# Patient Record
Sex: Female | Born: 1975 | Race: White | Hispanic: No | Smoking: Never smoker
Health system: Southern US, Community
[De-identification: ages and names within clinical notes are randomized; demographics above are authoritative.]

## PROBLEM LIST (undated history)

## (undated) HISTORY — PX: SHOULDER SURGERY: SHX246

## (undated) HISTORY — PX: ABDOMINAL HYSTERECTOMY: SHX81

---

## 1998-04-11 ENCOUNTER — Other Ambulatory Visit: Admission: RE | Admit: 1998-04-11 | Discharge: 1998-04-11 | Payer: Self-pay | Admitting: Obstetrics & Gynecology

## 1999-06-20 ENCOUNTER — Encounter: Admission: RE | Admit: 1999-06-20 | Discharge: 1999-06-20 | Payer: Self-pay | Admitting: Family Medicine

## 1999-06-20 ENCOUNTER — Encounter: Payer: Self-pay | Admitting: Family Medicine

## 1999-11-21 ENCOUNTER — Encounter: Payer: Self-pay | Admitting: Obstetrics and Gynecology

## 1999-11-21 ENCOUNTER — Ambulatory Visit (HOSPITAL_COMMUNITY): Admission: RE | Admit: 1999-11-21 | Discharge: 1999-11-21 | Payer: Self-pay | Admitting: Obstetrics and Gynecology

## 2000-04-16 ENCOUNTER — Inpatient Hospital Stay (HOSPITAL_COMMUNITY): Admission: AD | Admit: 2000-04-16 | Discharge: 2000-04-19 | Payer: Self-pay | Admitting: Obstetrics and Gynecology

## 2000-04-16 ENCOUNTER — Encounter (INDEPENDENT_AMBULATORY_CARE_PROVIDER_SITE_OTHER): Payer: Self-pay

## 2001-02-18 ENCOUNTER — Other Ambulatory Visit: Admission: RE | Admit: 2001-02-18 | Discharge: 2001-02-18 | Payer: Self-pay | Admitting: Obstetrics and Gynecology

## 2005-03-03 ENCOUNTER — Emergency Department (HOSPITAL_COMMUNITY): Admission: EM | Admit: 2005-03-03 | Discharge: 2005-03-03 | Payer: Self-pay | Admitting: Emergency Medicine

## 2005-03-17 ENCOUNTER — Ambulatory Visit (HOSPITAL_COMMUNITY): Admission: RE | Admit: 2005-03-17 | Discharge: 2005-03-17 | Payer: Self-pay | Admitting: Urology

## 2007-05-05 IMAGING — CT CT ABDOMEN W/O CM
1 series · 15 of 32 positions shown, 19 images · IV contrast (agent unspecified)
Comparison: None.

CLINICAL DATA: 29-year-old, with abdominal pain. 
 ABDOMEN CT WITHOUT CONTRAST:
TECHNIQUE: Multidetector CT imaging of the abdomen was performed following the standard protocol without IV contrast.
TECHNIQUE: Multidetector CT imaging of the pelvis was performed following the standard protocol without IV contrast.

[Series 2: renal stone · axial · 0.70mm/px · z∈[-439,-79]mm · 15 of 80 slices shown, 19 images]
[im 6/80  soft-tissue]
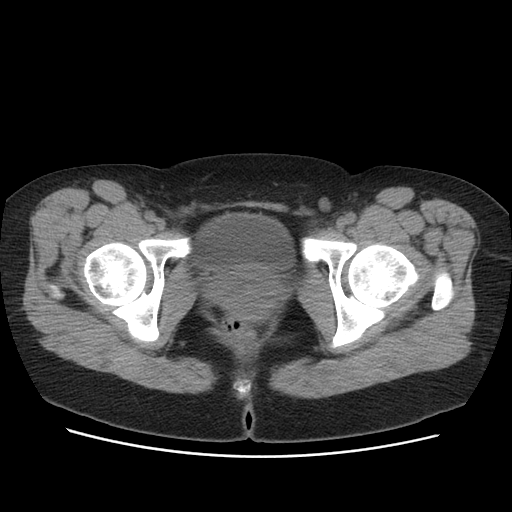
[im 6/80  bone]
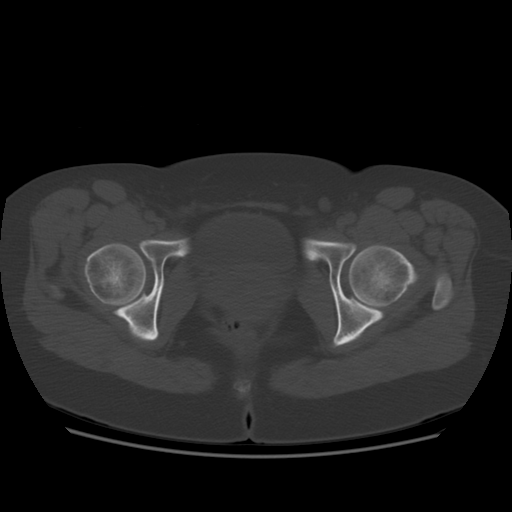
[im 11/80  soft-tissue]
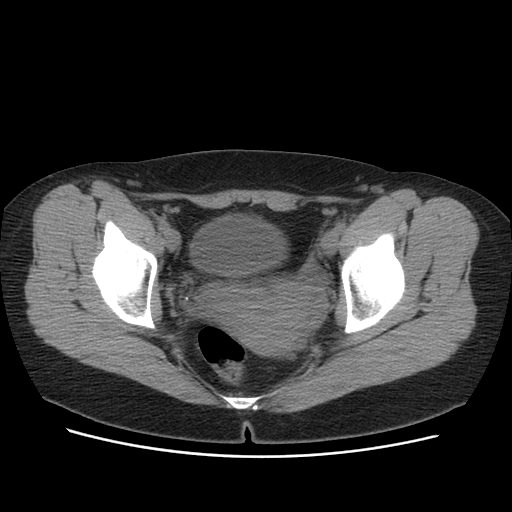
[im 16/80  soft-tissue]
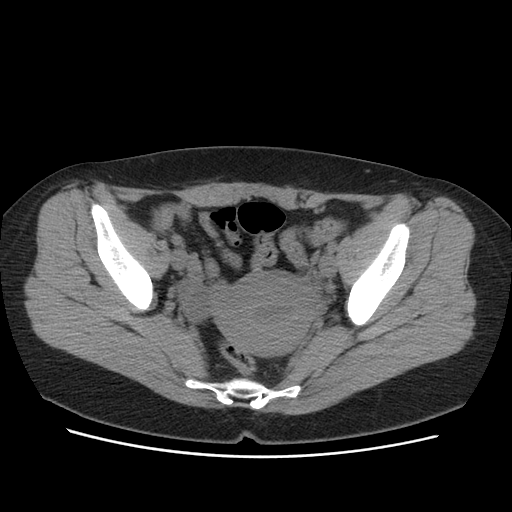
[im 23/80  soft-tissue]
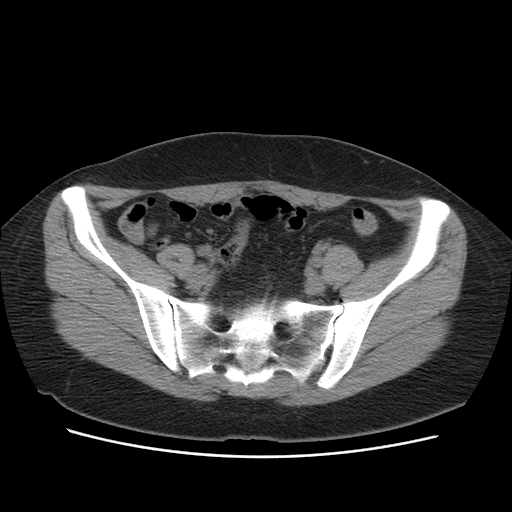
[im 29/80  soft-tissue]
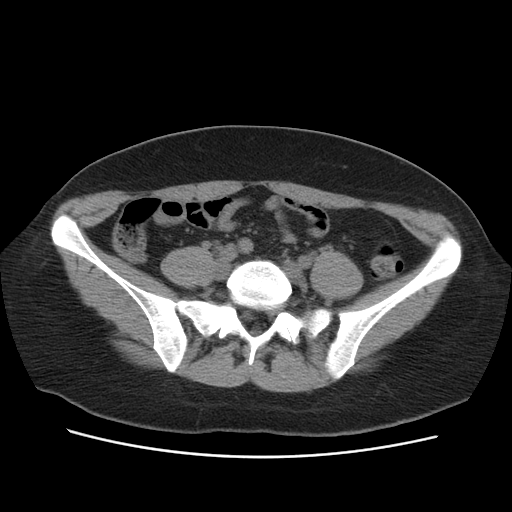
[im 34/80  soft-tissue]
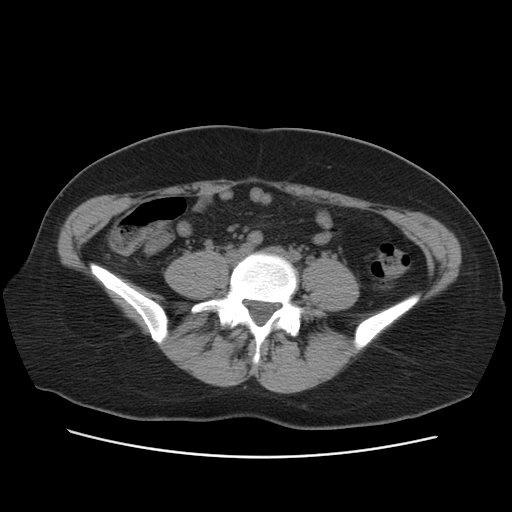
[im 41/80  soft-tissue]
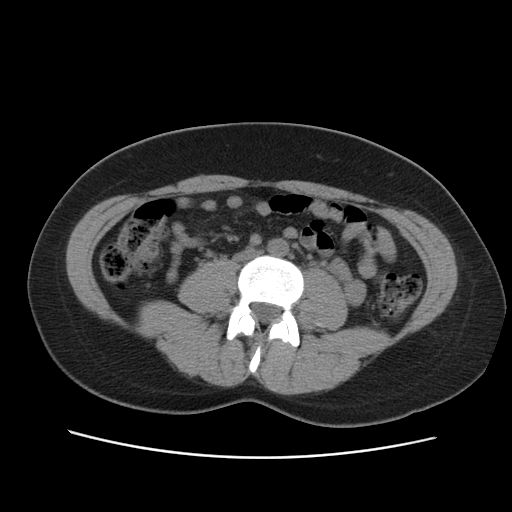
[im 46/80  soft-tissue]
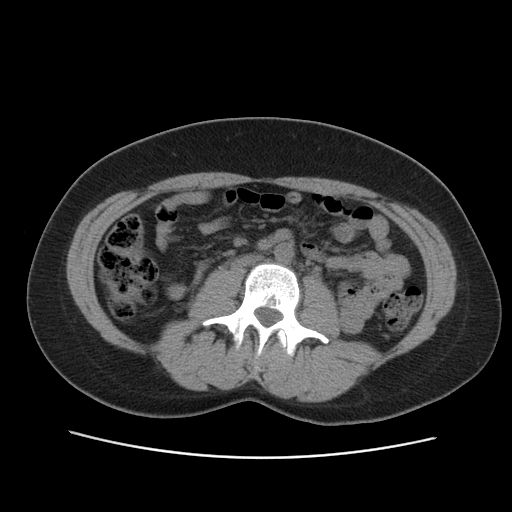
[im 51/80  soft-tissue]
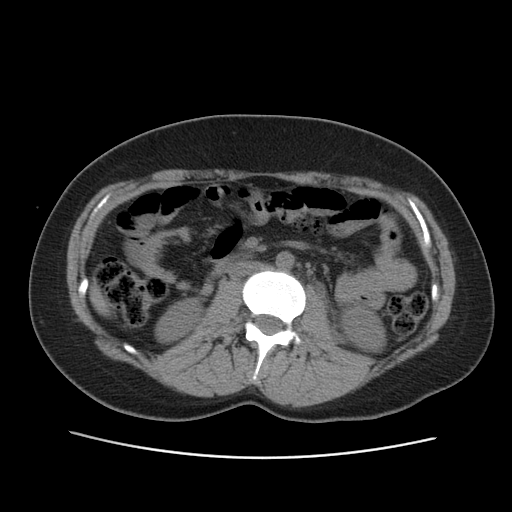
[im 51/80  bone]
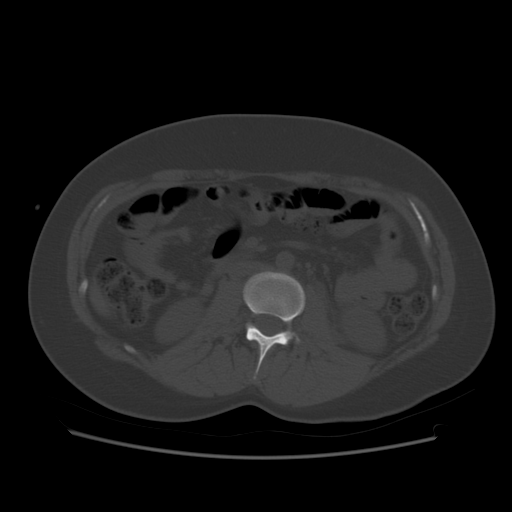
[im 57/80  soft-tissue]
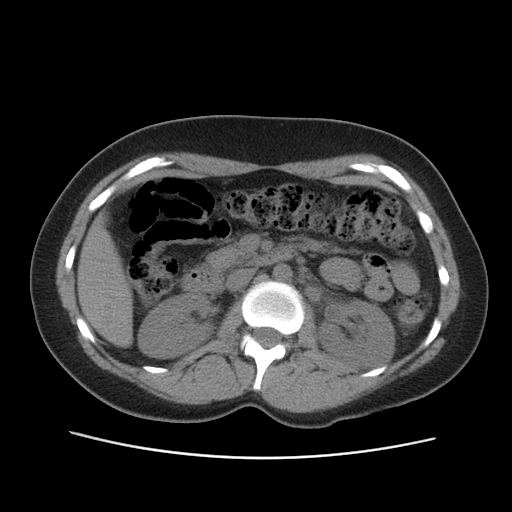
[im 64/80  soft-tissue]
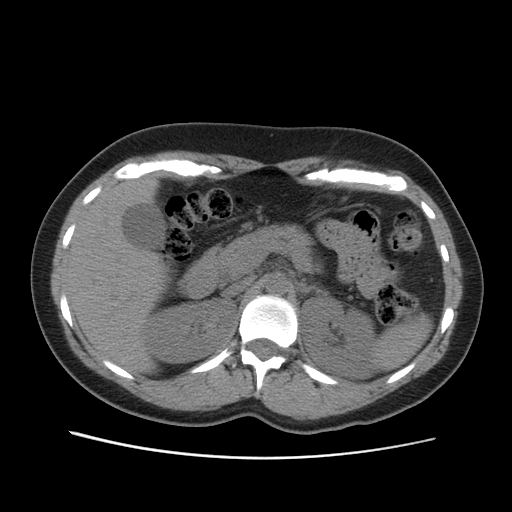
[im 69/80  soft-tissue]
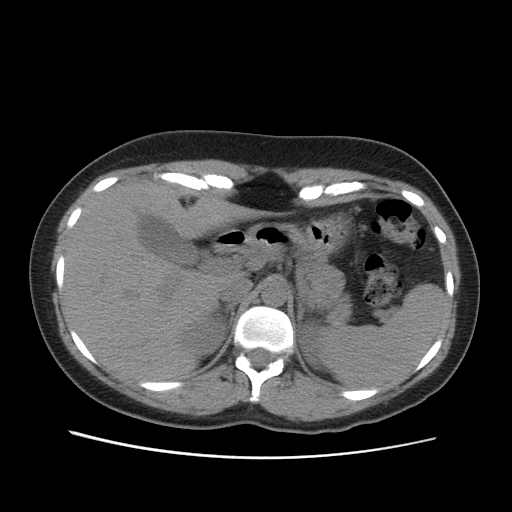
[im 69/80  lung]
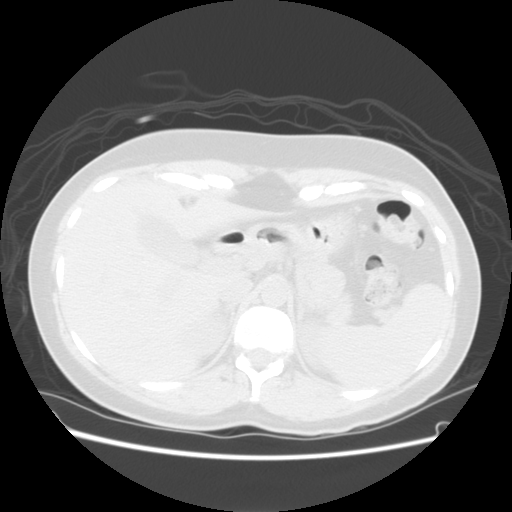
[im 72/80  lung]
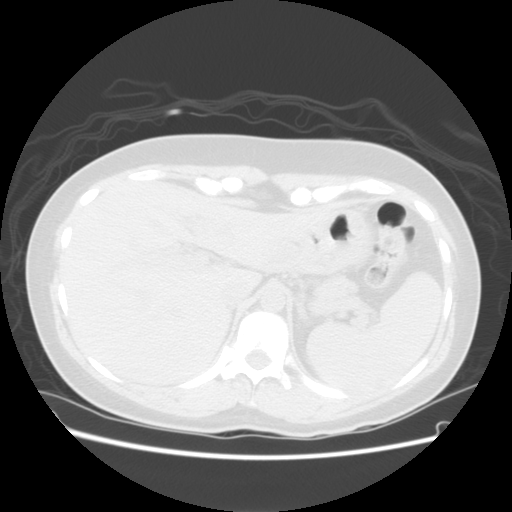
[im 74/80  soft-tissue]
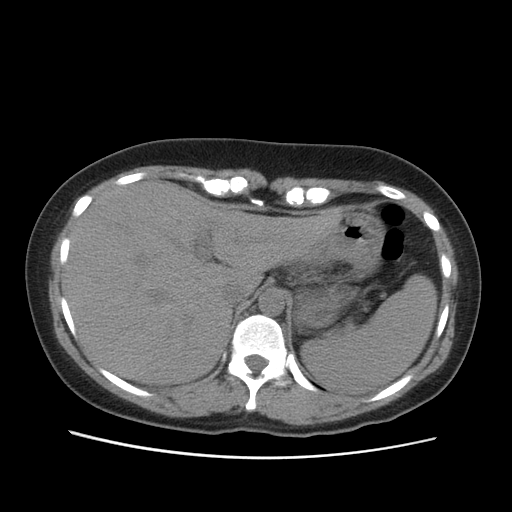
[im 74/80  lung]
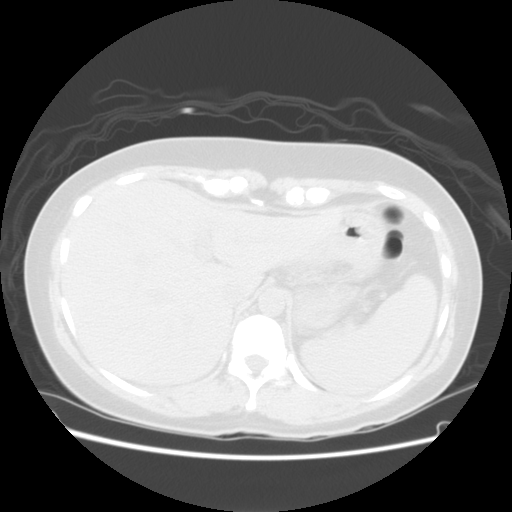
[im 77/80  lung]
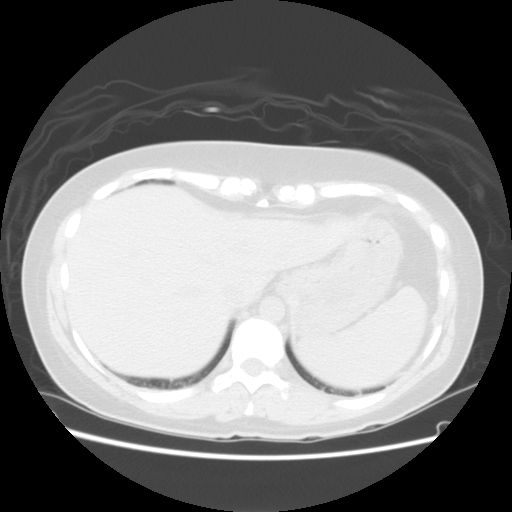

[15 of 32 positions shown; findings below may reference images not displayed]

FINDINGS: The lung bases are clear except for minimal dependent atelectasis. 
 The visualized unenhanced appearance of the liver and spleen are unremarkable.  The gallbladder appears normal.  Pancreas is grossly normal without contrast.   There are obstructive findings involving the left kidney.  There is mild hydronephrosis and there are perinephric interstitial changes and a probable small amount of perinephric fluid.  There is also inflammation around the left upper ureter which is dilated.  I do not however see any definite renal or ureteral calculi.  Findings are probably due to a recently passed stone.  Adrenal glands are unremarkable.  The right kidney is normal.  The stomach, duodenum, small bowel and colon are grossly normal but the study is limited without oral contrast.  No mesenteric or retroperitoneal masses or adenopathy.  
 The aorta is normal in caliber.
IMPRESSION: 1.  Obstructive findings involving the left kidney but no renal or ureteral calculi are seen.  Patient likely recently passed a calculus.
 2.  Remainder of the abdomen is unremarkable without contrast.  
 PELVIS CT WITHOUT CONTRAST:
FINDINGS: Mild left ureteral dilatation but no left ureteral calculus is seen.  No bladder calculi.  The uterus and ovaries are unremarkable.  The appendix is visualized and is normal.  The rectum, sigmoid colon, and visualized small bowel loops are grossly normal.
IMPRESSION: 1.  No ureteral or bladder calculi are seen.  Probable recently passed stone.
 2.  Normal uterus and ovaries.  Small amount of free pelvic fluid.

## 2009-06-07 ENCOUNTER — Encounter (INDEPENDENT_AMBULATORY_CARE_PROVIDER_SITE_OTHER): Payer: Self-pay | Admitting: Obstetrics and Gynecology

## 2009-06-07 ENCOUNTER — Ambulatory Visit (HOSPITAL_COMMUNITY): Admission: RE | Admit: 2009-06-07 | Discharge: 2009-06-08 | Payer: Self-pay | Admitting: Obstetrics and Gynecology

## 2010-05-22 LAB — COMPREHENSIVE METABOLIC PANEL
AST: 21 U/L (ref 0–37)
Albumin: 4.2 g/dL (ref 3.5–5.2)
Calcium: 9.7 mg/dL (ref 8.4–10.5)
Creatinine, Ser: 0.84 mg/dL (ref 0.4–1.2)
GFR calc Af Amer: >60 mL/min (ref >60)
GFR calc non Af Amer: >60 mL/min (ref >60)
Sodium: 136 mEq/L (ref 135–145)
Total Protein: 7.7 g/dL (ref 6.0–8.3)

## 2010-05-22 LAB — CBC
HCT: 34.9 % — ABNORMAL LOW (ref 36.0–46.0)
HCT: 39.3 % (ref 36.0–46.0)
Hemoglobin: 11.6 g/dL — ABNORMAL LOW (ref 12.0–15.0)
Hemoglobin: 13 g/dL (ref 12.0–15.0)
MCHC: 33.2 g/dL (ref 30.0–36.0)
MCHC: 33.2 g/dL (ref 30.0–36.0)
MCV: 86.8 fL (ref 78.0–100.0)
MCV: 87.7 fL (ref 78.0–100.0)
Platelets: 224 10*3/uL (ref 150–400)
RBC: 3.98 MIL/uL (ref 3.87–5.11)
RBC: 4.53 MIL/uL (ref 3.87–5.11)
RDW: 15 % (ref 11.5–15.5)
WBC: 7 10*3/uL (ref 4.0–10.5)

## 2010-06-10 ENCOUNTER — Other Ambulatory Visit: Payer: Self-pay | Admitting: Family Medicine

## 2010-06-10 DIAGNOSIS — R109 Unspecified abdominal pain: Secondary | ICD-10-CM

## 2010-06-11 ENCOUNTER — Ambulatory Visit
Admission: RE | Admit: 2010-06-11 | Discharge: 2010-06-11 | Disposition: A | Discharge status: Home / Self Care | Payer: BC Managed Care – PPO | Source: Ambulatory Visit | Attending: Family Medicine | Admitting: Family Medicine

## 2010-06-11 DIAGNOSIS — R109 Unspecified abdominal pain: Secondary | ICD-10-CM

## 2010-07-19 NOTE — Discharge Summary (Signed)
[facility]  Patient:    Pamela Harding, Pamela Harding                        MRN: [id number] Adm. Date:  [id number] Disc. Date: [id number] Attending:  Malon Kindle                           Discharge Summary  ADMISSION HISTORY:            A 35 year old white married female, para 1-0-1-1, gravida 3, at [redacted] weeks gestation admitted for C-section and tubal sterilization.  The patient declined VBAC.  She was adamant about wanting to proceed with tubal sterilization.  I pointed out extensively the disadvantages of tubal sterilization just after delivery of the second child.  She understood and wanted to proceed.  PRENATAL LABORATORY DATA:     Blood group and type B positive with a negative antibody, nonreactive serology, rubella immune, hepatitis B surface antigen negative, HIV negative, GC and Chlamydia negative, triple screen normal, one-hour Glucola 84.  HOSPITAL COURSE:              The patient underwent a low transverse cervical cesarean section and tubal sterilization by Dr. Ambrose Mantle with Dr. Jackelyn Knife assisting under spinal anesthesia with delivery of a 6-pound 12-ounce female infant with Apgars of 9 at one and 9 at five minutes.  Postpartum, the patient did well.  She had no complications in her postop course, and on the third postop day her staples were removed, strips were applied, and she was ready for discharge.  LABORATORY DATA:              Hemoglobin on admission 12.7, hematocrit 36.6, white count 9100, platelet count 178,000.  Follow-up hemoglobin 10.9, hematocrit 31.3.  Comprehensive metabolic profile was normal except for abnormalities associated with pregnancy.  Urinalysis was negative.  Blood group and type B positive with a negative antibody.  RPR was nonreactive.  FINAL DIAGNOSES:              1. Intrauterine pregnancy at 39 weeks, delivered                                  vertex by Cesarean section, declined vaginal           birth at the Cesarean.                               2. Voluntary sterilization.  OPERATIONS PERFORMED:         1. Low transverse cervical cesarean section.                               2. Bilateral tubal ligation.  DISCHARGE CONDITION:          Improved.  DISCHARGE INSTRUCTIONS:       Instructions include our regular discharge instruction booklet.  DISCHARGE MEDICATION:         Percocet 5/325, #16 tablets, one q.4-6h. p.r.n. pain.  DISCHARGE FOLLOWUP:           The patient is advised to return in two weeks for follow-up examination. DD:  04/19/00 TD:  04/19/00 Job: [id number]

## 2010-07-19 NOTE — Op Note (Signed)
[facility]  Patient:    Pamela Harding, Pamela Harding                        MRN: [id number] Proc. Date: 04/16/00 Adm. Date:  [id number] Attending:  Malon Kindle                           Operative Report  PREOPERATIVE DIAGNOSES:       1. Intrauterine pregnancy at 39 weeks.                               2. Previous C-section.                               3. Declined vaginal birth after cesarean                                  section.                               4. Voluntary sterilization.  POSTOPERATIVE DIAGNOSES:      1. Intrauterine pregnancy at 39 weeks.                               2. Previous C-section.                               3. Declined vaginal birth after cesarean                                  section.                               4. Voluntary sterilization.  OPERATION:                    Low transverse cesarean section and bilateral tubal ligation.  OPERATOR:                     Malachi Pro. Ambrose Mantle, M.D.  ASSISTANT:                    Zenaida Niece, M.D.  ANESTHESIA:                   Spinal.  DESCRIPTION OF PROCEDURE:     The patient was brought to the operating room and spinal anesthetic was given by Dr. Pamalee Leyden.  She was then placed in the lateral tilt position.  The abdomen, vulva, vagina and urethra were prepped with Betadine solution and a Foley catheter was inserted to straight drain. The patient was placed supine. The abdomen was draped as a sterile field.  The old incision was utilized and carried in layers through the skin, subcutaneous tissue and fascia.  The fascia was then separated from the rectus muscle superiorly and inferiorly.  The rectus muscle was then cut in the midline. The peritoneum was opened with the muscle.  The bladder was retracted away.  An incision was made in the lower uterine segment peritoneum.  The bladder was pushed inferiorly.  An incision was then made into the lower uterine  segment. Clear fluid was obtained.  The incision was extended laterally with bandage scissors.  A female infant was delivered from the vertex position without difficulty.  The cord was clamped after the nose and pharynx were suctioned with the bulb.  The infant was given to the neonatologist who was in attendance.  A segment of cord was preserved in case a pH was necessary. routine cord blood studies were obtained.  The placenta was removed.  The inside of the uterus was inspected and found to be free of debris.  The cervix was dilated with ring forceps.  The uterine incision was then closed in one layer using a running suture of 0 Vicryl, locking the suture as I went. After I completed this layer, there were several bleeding points that had to be sutured with 0 and 3-0 Vicryl.  Both ovaries appeared normal.  Both tubes were normal to the fimbriated ends.  I checked with the patient a final time to make sure she wanted to proceed with tubal ligation.  She did.  I created a window in the mesosalpinx bilaterally, placed two ties of 0 plain catgut proximally and distally on each tube and cut out the intervening segment of tube after I confirmed that there was a lumen present.  I checked for any bleeding. There was no bleeding.  Reinspection of the uterine incision site showed good hemostasis.  Liberal irrigation confirmed hemostasis.  No reperitonealization was done of the pelvis or the abdominal wall.  The rectus muscle was closed with interrupted sutures of 0 Vicryl.  The fascia was closed with two running sutures of 0 Vicryl, the subcu with running 3-0 Vicryl and the skin was close with automatic staples.  The patient tolerated the procedure well.  Blood loss was about 800 cc.  Sponge and needle counts were correct.  The infant had Apgars of 9 and 9 at one and five minutes. DD:  04/16/00 TD:  04/16/00 Job: [id number]

## 2010-07-19 NOTE — H&P (Signed)
[facility]  Patient:    Pamela Harding, Pamela Harding                          MRN: [id number] Adm. Date:  04/16/00 Attending:  Malachi Pro. Ambrose Mantle, M.D.                         History and Physical  HISTORY OF PRESENT ILLNESS:   This is a 35 year old white married female, para 1-0-1-1, gravida 3, with last menstrual period Jul 18, 1999 and [facility] April 26, 2000 by dates and April 22, 2000 by ultrasound, admitted for repeat C-section after declining vaginal birth after cesarean section and possible bilateral tubal ligation.  PRENATAL LABORATORY DATA:     Blood group and type: B positive with a negative antibody. Nonreactive serology. Rubella immune. Hepatitis B surface antigen negative. HIV negative. GC and Chlamydia negative. Triple screen normal. One-hour glucola 84.  This patient had a vaginal ultrasound September 19, 1999. Crown-rump length 2.3 cm and 9 weeks one day, [facility] April 22, 2000. Ultrasound on November 21, 1999 showed an average age gestational age of [redacted] weeks one day with an [facility] of April 22, 2000.  The patient had an uncomplicated prenatal course. She  declined vaginal birth after cesarean section. She requested a C-section even if she entered labor. She is admitted now for the repeat C-section and possible bilateral tubal ligation.  PAST MEDICAL HISTORY:         Pregnancy induced high blood pressure during her first pregnancy.  ALLERGIES:                    PENICILLIN.  OPERATIONS:                   Right elbow in 1984. C-section in 1994.  FAMILY HISTORY:               Father with high blood pressure and diabetes.  REVIEW OF SYSTEMS:            Noncontributory.  OBSTETRIC HISTORY:            August 1994 C-section of a 7-pound 8-ounce female infant. The patient had high blood pressure. In 1998 she had a spontaneous abortion.  PHYSICAL EXAMINATION:  GENERAL:                      Well-developed, well-nourished white female in no acute  distress.  VITAL SIGNS:                  Blood pressure 110/70. Pulse is 80.  HEENT:                        No cranial abnormalities. Extraocular movements are intact. Nose and pharynx clear.  NECK:                         Supple without thyromegaly.  HEART:                        Normal size and sounds. No murmurs.  LUNGS:                        Clear to P&A.  BREASTS:  Soft without masses.  ABDOMEN:                      Soft. Fundal height 37 cm. Fetal heart tones normal.  PELVIC:                       Cervix long and closed with presenting part at a -3.  ADMITTING IMPRESSION:         1. Intrauterine pregnancy at 39 weeks for repeat                                  cesarean section, declined vaginal birth                                  after cesarean section.                               2. Voluntary sterilization.  The patient was counseled at great length regarding tubal ligation, and was advised that I do not consider it to be in her best interest to proceed with tubal ligation at this time. She and her husband were both counseled. She understands that I will proceed with the procedure if she requests that I do it since it is not illegal, it is just unwise in my opinion. She has been counseled and she understands and I will go by her decision at the time of the surgery. DD:  04/15/00 TD:  04/15/00 Job: [id number]

## 2011-12-22 ENCOUNTER — Other Ambulatory Visit: Payer: Self-pay | Admitting: Obstetrics and Gynecology

## 2011-12-22 DIAGNOSIS — N644 Mastodynia: Secondary | ICD-10-CM

## 2011-12-29 ENCOUNTER — Ambulatory Visit
Admission: RE | Admit: 2011-12-29 | Discharge: 2011-12-29 | Disposition: A | Discharge status: Home / Self Care | Payer: BC Managed Care – PPO | Source: Ambulatory Visit | Attending: Obstetrics and Gynecology | Admitting: Obstetrics and Gynecology

## 2011-12-29 DIAGNOSIS — N644 Mastodynia: Secondary | ICD-10-CM

## 2012-06-02 ENCOUNTER — Telehealth: Payer: Self-pay | Admitting: Nurse Practitioner

## 2012-06-02 NOTE — Telephone Encounter (Signed)
Pt requesting refill on valtrex, last seen by [facility] 11/13, i dont know directions. Sending chart to you

## 2012-06-03 ENCOUNTER — Other Ambulatory Visit: Payer: Self-pay | Admitting: Physician Assistant

## 2012-06-03 DIAGNOSIS — B001 Herpesviral vesicular dermatitis: Secondary | ICD-10-CM

## 2012-06-03 MED ORDER — VALACYCLOVIR HCL 1 G PO TABS: 1000.0000 mg | ORAL_TABLET | Freq: Two times a day (BID) | ORAL | Status: DC

## 2012-06-04 ENCOUNTER — Other Ambulatory Visit: Payer: Self-pay | Admitting: Physician Assistant

## 2012-06-04 DIAGNOSIS — B001 Herpesviral vesicular dermatitis: Secondary | ICD-10-CM

## 2012-06-04 MED ORDER — VALACYCLOVIR HCL 1 G PO TABS: 1000.0000 mg | ORAL_TABLET | Freq: Two times a day (BID) | ORAL | Status: DC

## 2012-06-04 NOTE — Telephone Encounter (Signed)
Order authorized and sent to pharmacy

## 2012-06-07 ENCOUNTER — Other Ambulatory Visit: Payer: Self-pay | Admitting: *Deleted

## 2012-06-07 NOTE — Telephone Encounter (Signed)
Pt aware RX was done.

## 2012-08-30 ENCOUNTER — Ambulatory Visit (INDEPENDENT_AMBULATORY_CARE_PROVIDER_SITE_OTHER): Payer: BC Managed Care – PPO | Admitting: Physician Assistant

## 2012-08-30 ENCOUNTER — Telehealth: Payer: Self-pay | Admitting: Nurse Practitioner

## 2012-08-30 ENCOUNTER — Encounter: Payer: Self-pay | Admitting: Physician Assistant

## 2012-08-30 VITALS — BP 118/79 | HR 59 | Temp 99.7°F | Wt 155.0 lb

## 2012-08-30 DIAGNOSIS — M25569 Pain in unspecified knee: Secondary | ICD-10-CM

## 2012-08-30 DIAGNOSIS — M25562 Pain in left knee: Secondary | ICD-10-CM

## 2012-08-30 MED ORDER — MELOXICAM 15 MG PO TABS: 15.0000 mg | ORAL_TABLET | Freq: Every day | ORAL | Status: DC

## 2012-08-30 NOTE — Patient Instructions (Signed)
Knee Pain  The knee is the complex joint between your thigh and your lower leg. It is made up of bones, tendons, ligaments, and cartilage. The bones that make up the knee are:   The femur in the thigh.   The tibia and fibula in the lower leg.   The patella or kneecap riding in the groove on the lower femur.  CAUSES   Knee pain is a common complaint with many causes. A few of these causes are:   Injury, such as:   A ruptured ligament or tendon injury.   Torn cartilage.   Medical conditions, such as:   Gout   Arthritis   Infections   Overuse, over training or overdoing a physical activity.  Knee pain can be minor or severe. Knee pain can accompany debilitating injury. Minor knee problems often respond well to self-care measures or get well on their own. More serious injuries may need medical intervention or even surgery.  SYMPTOMS  The knee is complex. Symptoms of knee problems can vary widely. Some of the problems are:   Pain with movement and weight bearing.   Swelling and tenderness.   Buckling of the knee.   Inability to straighten or extend your knee.   Your knee locks and you cannot straighten it.   Warmth and redness with pain and fever.   Deformity or dislocation of the kneecap.  DIAGNOSIS   Determining what is wrong may be very straight forward such as when there is an injury. It can also be challenging because of the complexity of the knee. Tests to make a diagnosis may include:   Your caregiver taking a history and doing a physical exam.   Routine X-rays can be used to rule out other problems. X-rays will not reveal a cartilage tear. Some injuries of the knee can be diagnosed by:   Arthroscopy a surgical technique by which a small video camera is inserted through tiny incisions on the sides of the knee. This procedure is used to examine and repair internal knee joint problems. Tiny instruments can be used during arthroscopy to repair the torn knee cartilage (meniscus).   Arthrography  is a radiology technique. A contrast liquid is directly injected into the knee joint. Internal structures of the knee joint then become visible on X-ray film.   An MRI scan is a non x-ray radiology procedure in which magnetic fields and a computer produce two- or three-dimensional images of the inside of the knee. Cartilage tears are often visible using an MRI scanner. MRI scans have largely replaced arthrography in diagnosing cartilage tears of the knee.   Blood work.   Examination of the fluid that helps to lubricate the knee joint (synovial fluid). This is done by taking a sample out using a needle and a syringe.  TREATMENT  The treatment of knee problems depends on the cause. Some of these treatments are:   Depending on the injury, proper casting, splinting, surgery or physical therapy care will be needed.   Give yourself adequate recovery time. Do not overuse your joints. If you begin to get sore during workout routines, back off. Slow down or do fewer repetitions.   For repetitive activities such as cycling or running, maintain your strength and nutrition.   Alternate muscle groups. For example if you are a weight lifter, work the upper body on one day and the lower body the next.   Either tight or weak muscles do not give the proper support for your   knee. Tight or weak muscles do not absorb the stress placed on the knee joint. Keep the muscles surrounding the knee strong.   Take care of mechanical problems.   If you have flat feet, orthotics or special shoes may help. See your caregiver if you need help.   Arch supports, sometimes with wedges on the inner or outer aspect of the heel, can help. These can shift pressure away from the side of the knee most bothered by osteoarthritis.   A brace called an "unloader" brace also may be used to help ease the pressure on the most arthritic side of the knee.   If your caregiver has prescribed crutches, braces, wraps or ice, use as directed. The acronym for  this is PRICE. This means protection, rest, ice, compression and elevation.   Nonsteroidal anti-inflammatory drugs (NSAID's), can help relieve pain. But if taken immediately after an injury, they may actually increase swelling. Take NSAID's with food in your stomach. Stop them if you develop stomach problems. Do not take these if you have a history of ulcers, stomach pain or bleeding from the bowel. Do not take without your caregiver's approval if you have problems with fluid retention, heart failure, or kidney problems.   For ongoing knee problems, physical therapy may be helpful.   Glucosamine and chondroitin are over-the-counter dietary supplements. Both may help relieve the pain of osteoarthritis in the knee. These medicines are different from the usual anti-inflammatory drugs. Glucosamine may decrease the rate of cartilage destruction.   Injections of a corticosteroid drug into your knee joint may help reduce the symptoms of an arthritis flare-up. They may provide pain relief that lasts a few months. You may have to wait a few months between injections. The injections do have a small increased risk of infection, water retention and elevated blood sugar levels.   Hyaluronic acid injected into damaged joints may ease pain and provide lubrication. These injections may work by reducing inflammation. A series of shots may give relief for as long as 6 months.   Topical painkillers. Applying certain ointments to your skin may help relieve the pain and stiffness of osteoarthritis. Ask your pharmacist for suggestions. Many over the-counter products are approved for temporary relief of arthritis pain.   In some countries, doctors often prescribe topical NSAID's for relief of chronic conditions such as arthritis and tendinitis. A review of treatment with NSAID creams found that they worked as well as oral medications but without the serious side effects.  PREVENTION   Maintain a healthy weight. Extra pounds put  more strain on your joints.   Get strong, stay limber. Weak muscles are a common cause of knee injuries. Stretching is important. Include flexibility exercises in your workouts.   Be smart about exercise. If you have osteoarthritis, chronic knee pain or recurring injuries, you may need to change the way you exercise. This does not mean you have to stop being active. If your knees ache after jogging or playing basketball, consider switching to swimming, water aerobics or other low-impact activities, at least for a few days a week. Sometimes limiting high-impact activities will provide relief.   Make sure your shoes fit well. Choose footwear that is right for your sport.   Protect your knees. Use the proper gear for knee-sensitive activities. Use kneepads when playing volleyball or laying carpet. Buckle your seat belt every time you drive. Most shattered kneecaps occur in car accidents.   Rest when you are tired.  SEEK MEDICAL CARE IF:     You have knee pain that is continual and does not seem to be getting better.   SEEK IMMEDIATE MEDICAL CARE IF:   Your knee joint feels hot to the touch and you have a high fever.  MAKE SURE YOU:    Understand these instructions.   Will watch your condition.   Will get help right away if you are not doing well or get worse.  Document Released: 12/15/2006 Document Revised: 05/12/2011 Document Reviewed: 12/15/2006  [employer] Patient Information 2014 [employer].

## 2012-08-30 NOTE — Progress Notes (Signed)
Subjective:     Patient ID: Pamela Harding, female   DOB: 1975-12-25, 37 y.o.   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       Objective:   Physical Exam  Nursing note and vitals reviewed. No effusion noted FROM of the knee w/o click + TTP along the MCL/LCL No laxity No post joint TTP No post calf pain Good strength     Assessment:     LCL strain    Plan:     Mobic Heat/Ice Stretching pre and post ride Pt to check set up at bike shop F/U prn

## 2012-08-30 NOTE — Telephone Encounter (Signed)
Appt given

## 2012-09-15 ENCOUNTER — Encounter: Payer: Self-pay | Admitting: Physician Assistant

## 2012-09-15 ENCOUNTER — Ambulatory Visit (INDEPENDENT_AMBULATORY_CARE_PROVIDER_SITE_OTHER): Payer: BC Managed Care – PPO | Admitting: Physician Assistant

## 2012-09-15 ENCOUNTER — Ambulatory Visit (INDEPENDENT_AMBULATORY_CARE_PROVIDER_SITE_OTHER): Payer: BC Managed Care – PPO

## 2012-09-15 VITALS — BP 102/70 | HR 63 | Temp 97.3°F | Ht 66.0 in | Wt 157.0 lb

## 2012-09-15 DIAGNOSIS — B001 Herpesviral vesicular dermatitis: Secondary | ICD-10-CM

## 2012-09-15 DIAGNOSIS — M25562 Pain in left knee: Secondary | ICD-10-CM

## 2012-09-15 DIAGNOSIS — B009 Herpesviral infection, unspecified: Secondary | ICD-10-CM

## 2012-09-15 DIAGNOSIS — M25569 Pain in unspecified knee: Secondary | ICD-10-CM

## 2012-09-15 MED ORDER — VALACYCLOVIR HCL 1 G PO TABS: 1000.0000 mg | ORAL_TABLET | Freq: Two times a day (BID) | ORAL | Status: DC

## 2012-09-15 NOTE — Patient Instructions (Signed)
Knee Pain  The knee is the complex joint between your thigh and your lower leg. It is made up of bones, tendons, ligaments, and cartilage. The bones that make up the knee are:   The femur in the thigh.   The tibia and fibula in the lower leg.   The patella or kneecap riding in the groove on the lower femur.  CAUSES   Knee pain is a common complaint with many causes. A few of these causes are:   Injury, such as:   A ruptured ligament or tendon injury.   Torn cartilage.   Medical conditions, such as:   Gout   Arthritis   Infections   Overuse, over training or overdoing a physical activity.  Knee pain can be minor or severe. Knee pain can accompany debilitating injury. Minor knee problems often respond well to self-care measures or get well on their own. More serious injuries may need medical intervention or even surgery.  SYMPTOMS  The knee is complex. Symptoms of knee problems can vary widely. Some of the problems are:   Pain with movement and weight bearing.   Swelling and tenderness.   Buckling of the knee.   Inability to straighten or extend your knee.   Your knee locks and you cannot straighten it.   Warmth and redness with pain and fever.   Deformity or dislocation of the kneecap.  DIAGNOSIS   Determining what is wrong may be very straight forward such as when there is an injury. It can also be challenging because of the complexity of the knee. Tests to make a diagnosis may include:   Your caregiver taking a history and doing a physical exam.   Routine X-rays can be used to rule out other problems. X-rays will not reveal a cartilage tear. Some injuries of the knee can be diagnosed by:   Arthroscopy a surgical technique by which a small video camera is inserted through tiny incisions on the sides of the knee. This procedure is used to examine and repair internal knee joint problems. Tiny instruments can be used during arthroscopy to repair the torn knee cartilage (meniscus).   Arthrography  is a radiology technique. A contrast liquid is directly injected into the knee joint. Internal structures of the knee joint then become visible on X-ray film.   An MRI scan is a non x-ray radiology procedure in which magnetic fields and a computer produce two- or three-dimensional images of the inside of the knee. Cartilage tears are often visible using an MRI scanner. MRI scans have largely replaced arthrography in diagnosing cartilage tears of the knee.   Blood work.   Examination of the fluid that helps to lubricate the knee joint (synovial fluid). This is done by taking a sample out using a needle and a syringe.  TREATMENT  The treatment of knee problems depends on the cause. Some of these treatments are:   Depending on the injury, proper casting, splinting, surgery or physical therapy care will be needed.   Give yourself adequate recovery time. Do not overuse your joints. If you begin to get sore during workout routines, back off. Slow down or do fewer repetitions.   For repetitive activities such as cycling or running, maintain your strength and nutrition.   Alternate muscle groups. For example if you are a weight lifter, work the upper body on one day and the lower body the next.   Either tight or weak muscles do not give the proper support for your   knee. Tight or weak muscles do not absorb the stress placed on the knee joint. Keep the muscles surrounding the knee strong.   Take care of mechanical problems.   If you have flat feet, orthotics or special shoes may help. See your caregiver if you need help.   Arch supports, sometimes with wedges on the inner or outer aspect of the heel, can help. These can shift pressure away from the side of the knee most bothered by osteoarthritis.   A brace called an "unloader" brace also may be used to help ease the pressure on the most arthritic side of the knee.   If your caregiver has prescribed crutches, braces, wraps or ice, use as directed. The acronym for  this is PRICE. This means protection, rest, ice, compression and elevation.   Nonsteroidal anti-inflammatory drugs (NSAID's), can help relieve pain. But if taken immediately after an injury, they may actually increase swelling. Take NSAID's with food in your stomach. Stop them if you develop stomach problems. Do not take these if you have a history of ulcers, stomach pain or bleeding from the bowel. Do not take without your caregiver's approval if you have problems with fluid retention, heart failure, or kidney problems.   For ongoing knee problems, physical therapy may be helpful.   Glucosamine and chondroitin are over-the-counter dietary supplements. Both may help relieve the pain of osteoarthritis in the knee. These medicines are different from the usual anti-inflammatory drugs. Glucosamine may decrease the rate of cartilage destruction.   Injections of a corticosteroid drug into your knee joint may help reduce the symptoms of an arthritis flare-up. They may provide pain relief that lasts a few months. You may have to wait a few months between injections. The injections do have a small increased risk of infection, water retention and elevated blood sugar levels.   Hyaluronic acid injected into damaged joints may ease pain and provide lubrication. These injections may work by reducing inflammation. A series of shots may give relief for as long as 6 months.   Topical painkillers. Applying certain ointments to your skin may help relieve the pain and stiffness of osteoarthritis. Ask your pharmacist for suggestions. Many over the-counter products are approved for temporary relief of arthritis pain.   In some countries, doctors often prescribe topical NSAID's for relief of chronic conditions such as arthritis and tendinitis. A review of treatment with NSAID creams found that they worked as well as oral medications but without the serious side effects.  PREVENTION   Maintain a healthy weight. Extra pounds put  more strain on your joints.   Get strong, stay limber. Weak muscles are a common cause of knee injuries. Stretching is important. Include flexibility exercises in your workouts.   Be smart about exercise. If you have osteoarthritis, chronic knee pain or recurring injuries, you may need to change the way you exercise. This does not mean you have to stop being active. If your knees ache after jogging or playing basketball, consider switching to swimming, water aerobics or other low-impact activities, at least for a few days a week. Sometimes limiting high-impact activities will provide relief.   Make sure your shoes fit well. Choose footwear that is right for your sport.   Protect your knees. Use the proper gear for knee-sensitive activities. Use kneepads when playing volleyball or laying carpet. Buckle your seat belt every time you drive. Most shattered kneecaps occur in car accidents.   Rest when you are tired.  SEEK MEDICAL CARE IF:     You have knee pain that is continual and does not seem to be getting better.   SEEK IMMEDIATE MEDICAL CARE IF:   Your knee joint feels hot to the touch and you have a high fever.  MAKE SURE YOU:    Understand these instructions.   Will watch your condition.   Will get help right away if you are not doing well or get worse.  Document Released: 12/15/2006 Document Revised: 05/12/2011 Document Reviewed: 12/15/2006  [employer] Patient Information 2014 [employer].

## 2012-09-15 NOTE — Progress Notes (Signed)
Subjective:     Patient ID: Pamela Harding, female   DOB: 1975/07/15, 37 y.o.   MRN: [id number]  HPI Pt here for review of her L knee pain Sx have progressed and is having more sx with kneeling, squatting, and going up and down steps Pt also having swelling to the knee Mobic did not help the sx Still no locking or giving way She has not rode the bike since the last visit  Review of Systems  All other systems reviewed and are negative.       Objective:   Physical Exam  Nursing note and vitals reviewed. No effusion noted today FROM of the knee w/o crepitus No laxity noted + TTP lateral knee and medial femoral condyle No sx with patellar compression Good strength distal Xray- no loose bodies noted     Assessment:     1. Knee pain, acute, left   2. Recurrent cold sores        Plan:     Since NSAIDS did not help hold further use Sx have been progressive for 1 month so will set up for MRI Ice/Heat OTC meds for sx relief F/U pending labs

## 2012-09-20 ENCOUNTER — Telehealth: Payer: Self-pay

## 2012-09-20 NOTE — Telephone Encounter (Signed)
Get appointment with [facility] orthopedics as noted in previous note

## 2012-09-20 NOTE — Telephone Encounter (Signed)
[employer] denied the MRI  Montey Hora saw the patient   What do you want to do from here?

## 2012-09-22 ENCOUNTER — Other Ambulatory Visit: Payer: Self-pay

## 2012-09-22 DIAGNOSIS — M25562 Pain in left knee: Secondary | ICD-10-CM

## 2012-09-30 ENCOUNTER — Ambulatory Visit: Payer: BC Managed Care – PPO | Attending: Specialist | Admitting: Physical Therapy

## 2012-09-30 DIAGNOSIS — R5381 Other malaise: Secondary | ICD-10-CM | POA: Insufficient documentation

## 2012-09-30 DIAGNOSIS — IMO0001 Reserved for inherently not codable concepts without codable children: Secondary | ICD-10-CM | POA: Insufficient documentation

## 2012-09-30 DIAGNOSIS — M25569 Pain in unspecified knee: Secondary | ICD-10-CM | POA: Insufficient documentation

## 2012-10-05 ENCOUNTER — Ambulatory Visit: Payer: BC Managed Care – PPO | Attending: Specialist | Admitting: Physical Therapy

## 2012-10-05 DIAGNOSIS — R5381 Other malaise: Secondary | ICD-10-CM | POA: Insufficient documentation

## 2012-10-05 DIAGNOSIS — IMO0001 Reserved for inherently not codable concepts without codable children: Secondary | ICD-10-CM | POA: Insufficient documentation

## 2012-10-05 DIAGNOSIS — M25569 Pain in unspecified knee: Secondary | ICD-10-CM | POA: Insufficient documentation

## 2012-10-07 ENCOUNTER — Ambulatory Visit: Payer: BC Managed Care – PPO | Admitting: Physical Therapy

## 2012-10-12 ENCOUNTER — Encounter: Payer: BC Managed Care – PPO | Admitting: *Deleted

## 2012-10-14 ENCOUNTER — Ambulatory Visit: Payer: BC Managed Care – PPO | Admitting: Physical Therapy

## 2012-10-19 ENCOUNTER — Encounter: Payer: BC Managed Care – PPO | Admitting: Physical Therapy

## 2012-10-20 ENCOUNTER — Ambulatory Visit: Payer: BC Managed Care – PPO | Admitting: Physical Therapy

## 2012-10-21 ENCOUNTER — Ambulatory Visit: Payer: BC Managed Care – PPO | Admitting: Physical Therapy

## 2012-10-25 ENCOUNTER — Ambulatory Visit: Payer: BC Managed Care – PPO | Admitting: Physical Therapy

## 2012-11-03 ENCOUNTER — Encounter: Payer: BC Managed Care – PPO | Admitting: Physical Therapy

## 2012-11-04 ENCOUNTER — Emergency Department (HOSPITAL_COMMUNITY)
Admission: EM | Admit: 2012-11-04 | Discharge: 2012-11-04 | Disposition: A | Discharge status: Home / Self Care | Payer: Worker's Compensation | Attending: Emergency Medicine | Admitting: Emergency Medicine

## 2012-11-04 ENCOUNTER — Encounter (HOSPITAL_COMMUNITY): Payer: Self-pay | Admitting: *Deleted

## 2012-11-04 DIAGNOSIS — Z79899 Other long term (current) drug therapy: Secondary | ICD-10-CM | POA: Diagnosis not present

## 2012-11-04 DIAGNOSIS — T6391XA Toxic effect of contact with unspecified venomous animal, accidental (unintentional), initial encounter: Secondary | ICD-10-CM | POA: Diagnosis not present

## 2012-11-04 DIAGNOSIS — T63461A Toxic effect of venom of wasps, accidental (unintentional), initial encounter: Secondary | ICD-10-CM | POA: Diagnosis not present

## 2012-11-04 DIAGNOSIS — Z791 Long term (current) use of non-steroidal anti-inflammatories (NSAID): Secondary | ICD-10-CM | POA: Insufficient documentation

## 2012-11-04 DIAGNOSIS — Z880 Allergy status to penicillin: Secondary | ICD-10-CM | POA: Diagnosis not present

## 2012-11-04 DIAGNOSIS — Y9389 Activity, other specified: Secondary | ICD-10-CM | POA: Insufficient documentation

## 2012-11-04 DIAGNOSIS — Y9229 Other specified public building as the place of occurrence of the external cause: Secondary | ICD-10-CM | POA: Diagnosis not present

## 2012-11-04 MED ORDER — EPINEPHRINE 0.3 MG/0.3ML IJ SOAJ: 0.3000 mg | INTRAMUSCULAR | Status: DC | PRN

## 2012-11-04 NOTE — ED Notes (Signed)
Given solumedrol 125mg , and benadryl 25mg  SIVP en route by EMS

## 2012-11-04 NOTE — ED Notes (Signed)
Pt alert & oriented x4, stable gait. Patient given discharge instructions, paperwork & prescription(s). Patient  instructed to stop at the registration desk to finish any additional paperwork. Patient verbalized understanding. Pt left department w/ no further questions. 

## 2012-11-04 NOTE — ED Provider Notes (Signed)
CSN: [id number]     Arrival date & time 11/04/12  1514 History  This chart was scribed for Geoffery Lyons, MD by Bennett Scrape, ED Scribe. This patient was seen in room APA02/APA02 and the patient's care was started at 3:18 PM.   Chief Complaint  Patient presents with  . Insect Bite    The history is provided by the patient. No language interpreter was used.    HPI Comments: Pamela Harding is a 37 y.o. 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 Pt states that she was practicing an emergency evacuation drill at school at the time. She reports associated symptoms of lightheadedness, raspy voice and wheezing. She was given 25 mg Solumedrol and 50 mg Benadryl en route with improvement. She denies having a h/o an allergic reaction. Pt does not have a h/o chronic medical conditions. She denies being on any daily medications.   History reviewed. No pertinent past medical history. Past Surgical History  Procedure Laterality Date  . Abdominal hysterectomy    . Cesarean section    . Shoulder surgery     Family History  Problem Relation Age of Onset  . Hyperlipidemia Mother   . Diabetes Father   . Cancer Father     pancreatic   History  Substance Use Topics  . Smoking status: Never Smoker   . Smokeless tobacco: Never Used  . Alcohol Use: No   No OB history provided.  Review of Systems  HENT: Positive for voice change. Negative for facial swelling.   Respiratory: Positive for wheezing. Negative for cough and shortness of breath.   Skin: Negative for rash.  Neurological: Positive for light-headedness. Negative for syncope.  All other systems reviewed and are negative.    Allergies  Penicillins  Home Medications   Current Outpatient Rx  Name  Route  Sig  Dispense  Refill  . meloxicam (MOBIC) 15 MG tablet   Oral   Take 1 tablet  (15 mg total) by mouth daily.   15 tablet   0   . valACYclovir (VALTREX) 1000 MG tablet   Oral   Take 1 tablet (1,000 mg total) by mouth 2 (two) times daily.   30 tablet   6    Triage Vitals: BP 120/87  Pulse 86  Temp(Src) 98.1 F (36.7 C) (Oral)  Resp 16  Ht 5\' 6"  (1.676 m)  Wt 155 lb (70.308 kg)  BMI 25.03 kg/m2  SpO2 100%  Physical Exam  Nursing note and vitals reviewed. Constitutional: She is oriented to person, place, and time. She appears well-developed and well-nourished. No distress.  HENT:  Head: Normocephalic and atraumatic.  Oropharynx is clear with no edema  Eyes: Conjunctivae and EOM are normal.  Neck: Normal range of motion. Neck supple. No tracheal deviation present.  Cardiovascular: Normal rate, regular rhythm and normal heart sounds.   No murmur heard. Pulmonary/Chest: Effort normal and breath sounds normal. No stridor. No respiratory distress. She has no wheezes. She has no rales.  Abdominal: Soft. Bowel sounds are normal. There is no tenderness.  Musculoskeletal: Normal range of motion. She exhibits no edema.  Neurological: She is alert and oriented to person, place, and time. No cranial nerve deficit.  Skin: Skin is warm and dry.  Psychiatric: She has a normal mood and affect. Her behavior is normal.    ED  Course  Procedures (including critical care time)  DIAGNOSTIC STUDIES: Oxygen Saturation is 100% on room air, normal by my interpretation.    COORDINATION OF CARE: 3:22 PM-Advised pt that her vitals are stable and no swelling is noted on exam. Discussed treatment plan which includes observation to ensure no return of symptoms with pt at bedside and pt agreed to plan.   Labs Review Labs Reviewed - No data to display Imaging Review No results found.  MDM  No diagnosis found. Patient presents here with complaints of dizziness and lightheadedness after being stung by several bees. EMS had given her medications in the field she is now feeling  better. She was observed for over an hour and continues to feel fine. There is no stridor vital signs have remained stable. I believe she is stable for discharge.  I personally performed the services described in this documentation, which was scribed in my presence. The recorded information has been reviewed and is accurate.       Geoffery Lyons, MD 11/04/12 [phone]

## 2012-11-04 NOTE — ED Notes (Signed)
Pt stung multiple times today, no HX of allergic reaction but felt light headed, raspy voice, and wheezing en route per ems.

## 2012-11-08 ENCOUNTER — Encounter: Payer: BC Managed Care – PPO | Admitting: Physical Therapy

## 2013-01-06 ENCOUNTER — Other Ambulatory Visit: Payer: Self-pay

## 2013-02-11 ENCOUNTER — Encounter: Payer: BC Managed Care – PPO | Admitting: General Practice

## 2013-02-18 ENCOUNTER — Ambulatory Visit (INDEPENDENT_AMBULATORY_CARE_PROVIDER_SITE_OTHER): Payer: BC Managed Care – PPO | Admitting: General Practice

## 2013-02-18 ENCOUNTER — Encounter: Payer: Self-pay | Admitting: General Practice

## 2013-02-18 VITALS — BP 124/74 | HR 52 | Temp 97.2°F | Ht 66.25 in | Wt 154.0 lb

## 2013-02-18 DIAGNOSIS — R5383 Other fatigue: Secondary | ICD-10-CM

## 2013-02-18 DIAGNOSIS — Z0000 Encounter for general adult medical examination without abnormal findings: Secondary | ICD-10-CM

## 2013-02-18 DIAGNOSIS — Z23 Encounter for immunization: Secondary | ICD-10-CM

## 2013-02-18 DIAGNOSIS — R5381 Other malaise: Secondary | ICD-10-CM

## 2013-02-18 LAB — POCT CBC
Hemoglobin: 14 g/dL (ref 12.2–16.2)
MCH, POC: 29.1 pg (ref 27–31.2)
MPV: 8.1 fL (ref 0–99.8)
POC Granulocyte: 4 (ref 2–6.9)
RBC: 4.8 M/uL (ref 4.04–5.48)
WBC: 7.2 10*3/uL (ref 4.6–10.2)

## 2013-02-18 NOTE — Patient Instructions (Signed)

## 2013-02-18 NOTE — Progress Notes (Signed)
   Subjective:    Patient ID: Pamela Harding, female    DOB: 01-Oct-1975, 37 y.o.   MRN: [id number]  HPI Patient presents today for annual exam. She reports taking a multivitamin OTC daily. Reports exercising 3 times weekly for at least 1 hour. Reports feeling more tired, poor sleeping (interrupted or difficulty falling asleep), fatigue. Worsening of these symptoms over past 3-4 months. Reports eating a healthy meal 3 times daily and denies caffeine.     Review of Systems  Constitutional: Positive for chills. Negative for fever.  HENT: Negative for congestion, rhinorrhea and sore throat.   Respiratory: Negative for chest tightness, shortness of breath and wheezing.   Cardiovascular: Negative for chest pain and palpitations.  Gastrointestinal: Negative for nausea, vomiting, abdominal pain, diarrhea, constipation and blood in stool.  Endocrine: Positive for cold intolerance.  Genitourinary: Negative for dysuria, frequency, hematuria and difficulty urinating.  Neurological: Negative for dizziness, weakness and headaches.       Objective:   Physical Exam  Constitutional: She is oriented to person, place, and time. She appears well-developed and well-nourished.  HENT:  Head: Normocephalic and atraumatic.  Right Ear: External ear normal.  Left Ear: External ear normal.  Mouth/Throat: Oropharynx is clear and moist.  Eyes: EOM are normal. Pupils are equal, round, and reactive to light.  Neck: Normal range of motion. Neck supple. No thyromegaly present.  Cardiovascular: Normal heart sounds.  Bradycardia present.   Pulmonary/Chest: Effort normal and breath sounds normal. No respiratory distress. She exhibits no tenderness.  Abdominal: Soft. Bowel sounds are normal. She exhibits no distension. There is no tenderness.  Lymphadenopathy:    She has no cervical adenopathy.  Neurological: She is alert and oriented to person, place, and time.  Skin: Skin is warm and dry.  Psychiatric: She has a  normal mood and affect.          Assessment & Plan:  1. Annual physical exam  - CMP14+EGFR - POCT CBC  2. Fatigue  - Vitamin B12 - Thyroid Panel With TSH -lab pending -RTO if symptoms worsen -Patient verbalized understanding Coralie Keens, FNP-C

## 2013-02-19 LAB — CMP14+EGFR
Albumin: 4.5 g/dL (ref 3.5–5.5)
Alkaline Phosphatase: 45 IU/L (ref 39–117)
BUN/Creatinine Ratio: 17 (ref 8–20)
BUN: 14 mg/dL (ref 6–20)
Chloride: 101 mmol/L (ref 97–108)
GFR calc Af Amer: 105 mL/min/{1.73_m2} (ref >59)
Sodium: 138 mmol/L (ref 134–144)
Total Bilirubin: 0.7 mg/dL (ref 0.0–1.2)

## 2013-02-19 LAB — THYROID PANEL WITH TSH
T3 Uptake Ratio: 29 % (ref 24–39)
T4, Total: 7.3 ug/dL (ref 4.5–12.0)

## 2013-02-19 LAB — VITAMIN B12: Vitamin B-12: 561 pg/mL (ref 211–946)

## 2013-07-06 ENCOUNTER — Ambulatory Visit: Payer: BC Managed Care – PPO | Admitting: General Practice

## 2013-07-28 ENCOUNTER — Encounter: Payer: Self-pay | Admitting: Family Medicine

## 2013-07-28 ENCOUNTER — Ambulatory Visit (INDEPENDENT_AMBULATORY_CARE_PROVIDER_SITE_OTHER): Payer: BC Managed Care – PPO | Admitting: Family Medicine

## 2013-07-28 VITALS — BP 102/63 | HR 58 | Temp 98.4°F | Ht 66.25 in | Wt 136.2 lb

## 2013-07-28 DIAGNOSIS — J329 Chronic sinusitis, unspecified: Secondary | ICD-10-CM

## 2013-07-28 DIAGNOSIS — J209 Acute bronchitis, unspecified: Secondary | ICD-10-CM

## 2013-07-28 MED ORDER — DOXYCYCLINE HYCLATE 100 MG PO TABS: 100.0000 mg | ORAL_TABLET | Freq: Two times a day (BID) | ORAL | Status: DC

## 2013-07-28 MED ORDER — METHYLPREDNISOLONE (PAK) 4 MG PO TABS: ORAL_TABLET | ORAL | Status: DC

## 2013-07-28 MED ORDER — BENZONATATE 100 MG PO CAPS: 200.0000 mg | ORAL_CAPSULE | Freq: Three times a day (TID) | ORAL | Status: DC | PRN

## 2013-07-28 NOTE — Progress Notes (Signed)
   Subjective:    Patient ID: Almond Lint, female    DOB: [birth year], 38 y.o.   MRN: [id number]  HPI This 38 y.o. female presents for evaluation of URI sx's.  She is having mucopurulent sinus Secretions and a cough.    Review of Systems    No chest pain, SOB, HA, dizziness, vision change, N/V, diarrhea, constipation, dysuria, urinary urgency or frequency, myalgias, arthralgias or rash.  Objective:   Physical Exam  Vital signs noted  Well developed well nourished female.  HEENT - Head atraumatic Normocephalic                Eyes - PERRLA, Conjuctiva - clear Sclera- Clear EOMI                Ears - EAC's Wnl TM's Wnl Gross Hearing WNL                Throat - oropharanx wnl Respiratory - Lungs CTA bilateral Cardiac - RRR S1 and S2 without murmur GI - Abdomen soft Nontender and bowel sounds active x 4      Assessment & Plan:  Sinusitis - Plan: doxycycline (VIBRA-TABS) 100 MG tablet, methylPREDNIsolone (MEDROL DOSPACK) 4 MG tablet, benzonatate (TESSALON PERLES) 100 MG capsule  Acute bronchitis - Plan: doxycycline (VIBRA-TABS) 100 MG tablet, methylPREDNIsolone (MEDROL DOSPACK) 4 MG tablet, benzonatate (TESSALON PERLES) 100 MG capsule  Deatra Canter FNP

## 2013-09-26 ENCOUNTER — Other Ambulatory Visit: Payer: Self-pay | Admitting: Physician Assistant

## 2013-12-05 ENCOUNTER — Telehealth: Payer: Self-pay | Admitting: Family Medicine

## 2013-12-05 ENCOUNTER — Encounter: Payer: Self-pay | Admitting: Family

## 2013-12-05 ENCOUNTER — Ambulatory Visit (INDEPENDENT_AMBULATORY_CARE_PROVIDER_SITE_OTHER): Payer: BC Managed Care – PPO | Admitting: Family

## 2013-12-05 VITALS — BP 105/72 | HR 54 | Temp 98.3°F | Ht 66.25 in | Wt 140.0 lb

## 2013-12-05 DIAGNOSIS — B001 Herpesviral vesicular dermatitis: Secondary | ICD-10-CM

## 2013-12-05 DIAGNOSIS — L03314 Cellulitis of groin: Secondary | ICD-10-CM

## 2013-12-05 MED ORDER — MUPIROCIN 2 % EX OINT: 1.0000 "application " | TOPICAL_OINTMENT | Freq: Two times a day (BID) | CUTANEOUS | Status: DC

## 2013-12-05 MED ORDER — CEPHALEXIN 500 MG PO CAPS: 500.0000 mg | ORAL_CAPSULE | Freq: Four times a day (QID) | ORAL | Status: DC

## 2013-12-05 MED ORDER — VALACYCLOVIR HCL 1 G PO TABS
ORAL_TABLET | ORAL | Status: DC
Start: 2013-12-05 — End: 2018-07-12

## 2013-12-05 NOTE — Progress Notes (Signed)
   Subjective:    Patient ID: Pamela Harding, female    DOB: [birth year], 38 y.o.   MRN: [id number]  HPI Pt presents to the office for cellulitis of left groin over the last 3 months. Pt states she bikes long distances and has had this in the past and usually treats it with Destin cream, but this has not improved. Pt states it is getting worse and "seems infected". Pt states it is draining a "yellow thick" drainage.    Review of Systems  Constitutional: Negative.   HENT: Negative.   Eyes: Negative.   Respiratory: Negative.  Negative for shortness of breath.   Cardiovascular: Negative.  Negative for palpitations.  Gastrointestinal: Negative.   Endocrine: Negative.   Genitourinary: Negative.   Musculoskeletal: Negative.   Skin: Positive for rash.  Neurological: Negative.  Negative for headaches.  Hematological: Negative.   Psychiatric/Behavioral: Negative.   All other systems reviewed and are negative.      Objective:   Physical Exam  Vitals reviewed. Constitutional: She is oriented to person, place, and time. She appears well-developed and well-nourished. No distress.  HENT:  Head: Normocephalic and atraumatic.  Right Ear: External ear normal.  Mouth/Throat: Oropharynx is clear and moist.  Eyes: Pupils are equal, round, and reactive to light.  Neck: Normal range of motion. Neck supple. No thyromegaly present.  Cardiovascular: Normal rate, regular rhythm, normal heart sounds and intact distal pulses.   No murmur heard. Pulmonary/Chest: Effort normal and breath sounds normal. No respiratory distress. She has no wheezes.  Abdominal: Soft. Bowel sounds are normal. She exhibits no distension. There is no tenderness.  Genitourinary: Uterus normal.  Musculoskeletal: Normal range of motion. She exhibits no edema and no tenderness.  Neurological: She is alert and oriented to person, place, and time. She has normal reflexes. No cranial nerve deficit.  Skin: Skin is warm and dry. Rash  noted. Rash is pustular (3 different pustular in left groin area with erythemas with a yellow drainage present).  Psychiatric: She has a normal mood and affect. Her behavior is normal. Judgment and thought content normal.    BP 105/72  Pulse 54  Temp(Src) 98.3 F (36.8 C) (Oral)  Ht 5' 6.25" (1.683 m)  Wt 140 lb (63.504 kg)  BMI 22.42 kg/m2       Assessment & Plan:  1. Cellulitis of groin -Keep area clean and dry -Wound care discussed -Discussed cross sensitivity of keflex with penicillin- Pt states it was a childhood allergy and does not what it causes- Pt told if she has any adverse reactions to stop Keflex and call us - cephALEXin (KEFLEX) 500 MG capsule; Take 1 capsule (500 mg total) by mouth 4 (four) times daily.  Dispense: 28 capsule; Refill: 0 - mupirocin ointment (BACTROBAN) 2 %; Place 1 application into the nose 2 (two) times daily.  Dispense: 22 g; Refill: 0  2. Recurrent cold sores  - valACYclovir (VALTREX) 1000 MG tablet; TAKE ONE TABLET BY MOUTH TWICE DAILY  Dispense: 30 tablet; Refill: 1  Jannifer Rodneyhristy Hawks, FNP

## 2013-12-05 NOTE — Telephone Encounter (Signed)
appt scheduled for today

## 2013-12-05 NOTE — Patient Instructions (Signed)

## 2013-12-14 ENCOUNTER — Encounter: Payer: Self-pay | Admitting: Family

## 2013-12-14 ENCOUNTER — Ambulatory Visit (INDEPENDENT_AMBULATORY_CARE_PROVIDER_SITE_OTHER): Payer: BC Managed Care – PPO | Admitting: Family

## 2013-12-14 VITALS — BP 103/67 | HR 48 | Temp 98.0°F | Ht 66.25 in | Wt 141.0 lb

## 2013-12-14 DIAGNOSIS — L02214 Cutaneous abscess of groin: Secondary | ICD-10-CM

## 2013-12-14 MED ORDER — CEPHALEXIN 500 MG PO CAPS: 500.0000 mg | ORAL_CAPSULE | Freq: Four times a day (QID) | ORAL | Status: DC

## 2013-12-14 NOTE — Patient Instructions (Signed)

## 2013-12-14 NOTE — Progress Notes (Signed)
   Subjective:    Patient ID: Pamela Harding, female    DOB: 24-Mar-1975, 38 y.o.   MRN: [id number]  HPI Pt presents to the office for follow-up for an abscess in groin area. Pt was given keflex antibiotic and Bactroban. Pt states yesterday evening it "pop with a lot of yellow and green liquid". Pt states the pain is better, but it feel "raw". PT states there is still a hard knot.    Review of Systems  Constitutional: Negative.   HENT: Negative.   Eyes: Negative.   Respiratory: Negative.  Negative for shortness of breath.   Cardiovascular: Negative.  Negative for palpitations.  Gastrointestinal: Negative.   Endocrine: Negative.   Genitourinary: Negative.   Musculoskeletal: Negative.   Neurological: Negative.  Negative for headaches.  Hematological: Negative.   Psychiatric/Behavioral: Negative.   All other systems reviewed and are negative.      Objective:   Physical Exam  Vitals reviewed. Constitutional: She is oriented to person, place, and time. She appears well-developed and well-nourished. No distress.  Eyes: Pupils are equal, round, and reactive to light.  Neck: Normal range of motion. Neck supple. No thyromegaly present.  Cardiovascular: Normal rate, regular rhythm, normal heart sounds and intact distal pulses.   No murmur heard. Pulmonary/Chest: Effort normal and breath sounds normal. No respiratory distress. She has no wheezes.  Abdominal: Soft. Bowel sounds are normal. She exhibits no distension. There is no tenderness.  Musculoskeletal: Normal range of motion. She exhibits no edema and no tenderness.  Neurological: She is alert and oriented to person, place, and time. She has normal reflexes. No cranial nerve deficit.  Skin: Skin is warm and dry. There is erythema (erythemas hard abscess inside left groin- ).  Abscess inside left groin- Expressed yellow pus out of abscess- still erythemas and hard areas  Psychiatric: She has a normal mood and affect. Her behavior is  normal. Judgment and thought content normal.    BP 103/67  Pulse 48  Temp(Src) 98 F (36.7 C) (Oral)  Ht 5' 6.25" (1.683 m)  Wt 141 lb (63.957 kg)  BMI 22.58 kg/m2       Assessment & Plan:  1. Abscess of groin, left -Warm compresses -Sitz baths -Keep clean and dry - cephALEXin (KEFLEX) 500 MG capsule; Take 1 capsule (500 mg total) by mouth 4 (four) times daily.  Dispense: 28 capsule; Refill: 0  Jannifer Rodneyhristy Lurdes Haltiwanger, FNP

## 2014-01-18 ENCOUNTER — Encounter: Payer: Self-pay | Admitting: Family Medicine

## 2014-01-18 ENCOUNTER — Ambulatory Visit (INDEPENDENT_AMBULATORY_CARE_PROVIDER_SITE_OTHER): Payer: BC Managed Care – PPO | Admitting: Family Medicine

## 2014-01-18 VITALS — BP 99/64 | HR 68 | Temp 98.0°F | Ht 66.25 in | Wt 140.4 lb

## 2014-01-18 DIAGNOSIS — N390 Urinary tract infection, site not specified: Secondary | ICD-10-CM

## 2014-01-18 DIAGNOSIS — R300 Dysuria: Secondary | ICD-10-CM

## 2014-01-18 DIAGNOSIS — L03314 Cellulitis of groin: Secondary | ICD-10-CM

## 2014-01-18 LAB — POCT URINALYSIS DIPSTICK
Bilirubin, UA: NEGATIVE
Glucose, UA: NEGATIVE
Ketones, UA: NEGATIVE
Leukocytes, UA: NEGATIVE
Nitrite, UA: NEGATIVE
Protein, UA: NEGATIVE
Spec Grav, UA: <=1.005
Urobilinogen, UA: NEGATIVE
pH, UA: 6.5

## 2014-01-18 LAB — POCT UA - MICROSCOPIC ONLY
Casts, Ur, LPF, POC: NEGATIVE
Crystals, Ur, HPF, POC: NEGATIVE
Mucus, UA: NEGATIVE
Yeast, UA: NEGATIVE

## 2014-01-18 MED ORDER — CIPROFLOXACIN HCL 500 MG PO TABS: 500.0000 mg | ORAL_TABLET | Freq: Two times a day (BID) | ORAL | Status: DC

## 2014-01-18 MED ORDER — FLUCONAZOLE 150 MG PO TABS: 150.0000 mg | ORAL_TABLET | Freq: Once | ORAL | Status: DC

## 2014-01-18 MED ORDER — MUPIROCIN 2 % EX OINT: 1.0000 "application " | TOPICAL_OINTMENT | Freq: Two times a day (BID) | CUTANEOUS | Status: DC

## 2014-01-18 NOTE — Progress Notes (Signed)
   Subjective:    Patient ID: Pamela LintSherri R Mellone, female    DOB: 04/28/1975, 38 y.o.   MRN: [id number]  HPI Patient c/o dysuria and left pelvic pain.  Review of Systems  Constitutional: Negative for fever.  HENT: Negative for ear pain.   Eyes: Negative for discharge.  Respiratory: Negative for cough.   Cardiovascular: Negative for chest pain.  Gastrointestinal: Negative for abdominal distention.  Endocrine: Negative for polyuria.  Genitourinary: Positive for dysuria, hematuria and pelvic pain. Negative for difficulty urinating.  Musculoskeletal: Negative for gait problem and neck pain.  Skin: Negative for color change and rash.  Neurological: Negative for speech difficulty and headaches.  Psychiatric/Behavioral: Negative for agitation.       Objective:    BP 99/64 mmHg  Pulse 68  Temp(Src) 98 F (36.7 C) (Oral)  Ht 5' 6.25" (1.683 m)  Wt 140 lb 6.4 oz (63.685 kg)  BMI 22.48 kg/m2 Physical Exam  Constitutional: She is oriented to person, place, and time. She appears well-developed and well-nourished.  HENT:  Head: Normocephalic and atraumatic.  Mouth/Throat: Oropharynx is clear and moist.  Eyes: Pupils are equal, round, and reactive to light.  Neck: Normal range of motion. Neck supple.  Cardiovascular: Normal rate and regular rhythm.   No murmur heard. Pulmonary/Chest: Effort normal and breath sounds normal.  Abdominal: Soft. Bowel sounds are normal. There is no tenderness.  Neurological: She is alert and oriented to person, place, and time.  Skin: Skin is warm and dry.  Psychiatric: She has a normal mood and affect.   Results for orders placed or performed in visit on 01/18/14  POCT UA - Microscopic Only  Result Value Ref Range   WBC, Ur, HPF, POC 1-3    RBC, urine, microscopic 1-3    Bacteria, U Microscopic occ    Mucus, UA negative    Epithelial cells, urine per micros few    Crystals, Ur, HPF, POC negative    Casts, Ur, LPF, POC negative    Yeast, UA negative     POCT urinalysis dipstick  Result Value Ref Range   Color, UA gold    Clarity, UA clear    Glucose, UA negative    Bilirubin, UA negative    Ketones, UA negative    Spec Grav, UA <=1.005    Blood, UA moderate    pH, UA 6.5    Protein, UA negative    Urobilinogen, UA negative    Nitrite, UA negative    Leukocytes, UA Negative          Assessment & Plan:     ICD-9-CM ICD-10-CM   1. Dysuria 788.1 R30.0 POCT UA - Microscopic Only     POCT urinalysis dipstick     mupirocin ointment (BACTROBAN) 2 %     ciprofloxacin (CIPRO) 500 MG tablet     fluconazole (DIFLUCAN) 150 MG tablet     Urine culture  2. Cellulitis of groin 682.2 L03.314 mupirocin ointment (BACTROBAN) 2 %     ciprofloxacin (CIPRO) 500 MG tablet     fluconazole (DIFLUCAN) 150 MG tablet     Urine culture  3. Urinary tract infection without hematuria, site unspecified 599.0 N39.0 mupirocin ointment (BACTROBAN) 2 %     ciprofloxacin (CIPRO) 500 MG tablet     fluconazole (DIFLUCAN) 150 MG tablet     Urine culture     Return if symptoms worsen or fail to improve.  Deatra CanterWilliam J Jocelyn Lowery FNP

## 2014-01-21 LAB — URINE CULTURE

## 2014-02-14 ENCOUNTER — Ambulatory Visit: Payer: BC Managed Care – PPO | Admitting: Family Medicine

## 2014-11-28 ENCOUNTER — Other Ambulatory Visit: Payer: Self-pay | Admitting: Obstetrics and Gynecology

## 2014-11-28 DIAGNOSIS — N6311 Unspecified lump in the right breast, upper outer quadrant: Secondary | ICD-10-CM

## 2014-11-28 DIAGNOSIS — N644 Mastodynia: Secondary | ICD-10-CM

## 2014-11-30 ENCOUNTER — Ambulatory Visit
Admission: RE | Admit: 2014-11-30 | Discharge: 2014-11-30 | Disposition: A | Discharge status: Home / Self Care | Payer: BC Managed Care – PPO | Source: Ambulatory Visit | Attending: Obstetrics and Gynecology | Admitting: Obstetrics and Gynecology

## 2014-11-30 DIAGNOSIS — N6311 Unspecified lump in the right breast, upper outer quadrant: Secondary | ICD-10-CM

## 2014-11-30 DIAGNOSIS — N644 Mastodynia: Secondary | ICD-10-CM

## 2015-03-15 ENCOUNTER — Ambulatory Visit: Payer: BC Managed Care – PPO | Admitting: Family

## 2015-03-22 ENCOUNTER — Ambulatory Visit: Payer: BC Managed Care – PPO | Admitting: Pediatrics

## 2015-03-23 ENCOUNTER — Encounter: Payer: Self-pay | Admitting: Family

## 2016-01-04 ENCOUNTER — Other Ambulatory Visit: Payer: Self-pay | Admitting: Specialist

## 2016-01-04 DIAGNOSIS — G8929 Other chronic pain: Secondary | ICD-10-CM

## 2016-01-04 DIAGNOSIS — M25511 Pain in right shoulder: Principal | ICD-10-CM

## 2017-02-13 ENCOUNTER — Ambulatory Visit: Payer: BC Managed Care – PPO

## 2018-07-09 ENCOUNTER — Other Ambulatory Visit: Payer: Self-pay

## 2018-07-12 ENCOUNTER — Ambulatory Visit: Payer: BC Managed Care – PPO | Admitting: Family

## 2018-07-12 ENCOUNTER — Other Ambulatory Visit: Payer: Self-pay

## 2018-07-12 ENCOUNTER — Ambulatory Visit (INDEPENDENT_AMBULATORY_CARE_PROVIDER_SITE_OTHER): Payer: BC Managed Care – PPO | Admitting: Family

## 2018-07-12 ENCOUNTER — Encounter: Payer: Self-pay | Admitting: Family

## 2018-07-12 VITALS — BP 121/81 | HR 69 | Temp 97.4°F | Ht 66.25 in | Wt 150.2 lb

## 2018-07-12 DIAGNOSIS — Z91030 Bee allergy status: Secondary | ICD-10-CM

## 2018-07-12 DIAGNOSIS — Z0000 Encounter for general adult medical examination without abnormal findings: Secondary | ICD-10-CM

## 2018-07-12 DIAGNOSIS — Z111 Encounter for screening for respiratory tuberculosis: Secondary | ICD-10-CM

## 2018-07-12 DIAGNOSIS — Z0001 Encounter for general adult medical examination with abnormal findings: Secondary | ICD-10-CM

## 2018-07-12 MED ORDER — EPINEPHRINE 0.3 MG/0.3ML IJ SOAJ: 0.3000 mg | INTRAMUSCULAR | 0 refills | Status: AC | PRN

## 2018-07-12 MED ORDER — EPINEPHRINE 0.3 MG/0.3ML IJ SOAJ: 0.3000 mg | INTRAMUSCULAR | 0 refills | Status: DC | PRN

## 2018-07-12 NOTE — Progress Notes (Signed)
Subjective:    Patient ID: Pamela Harding, female    DOB: [birth year], 43 y.o.   MRN: [id number]  Chief Complaint  Patient presents with  . form for work    HPI Pt presents to the office today for CPE without pap. She is followed by GYN annually. She is currently not taking any medications at this time. She does have rx for Epi Pen as needed for bee stings. Requesting new rx.   Pt denies any headache, palpitations, SOB, or edema at this time.    Pt has form to complete for work.    Review of Systems  All other systems reviewed and are negative.  Family History  Problem Relation Age of Onset  . Hyperlipidemia Mother   . Diabetes Father   . Cancer Father        pancreatic   Social History   Socioeconomic History  . Marital status: Divorced    Spouse name: Not on file  . Number of children: Not on file  . Years of education: Not on file  . Highest education level: Not on file  Occupational History  . Not on file  Social Needs  . Financial resource strain: Not on file  . Food insecurity:    Worry: Not on file    Inability: Not on file  . Transportation needs:    Medical: Not on file    Non-medical: Not on file  Tobacco Use  . Smoking status: Never Smoker  . Smokeless tobacco: Never Used  Substance and Sexual Activity  . Alcohol use: No  . Drug use: No  . Sexual activity: Not on file  Lifestyle  . Physical activity:    Days per week: Not on file    Minutes per session: Not on file  . Stress: Not on file  Relationships  . Social connections:    Talks on phone: Not on file    Gets together: Not on file    Attends religious service: Not on file    Active member of club or organization: Not on file    Attends meetings of clubs or organizations: Not on file    Relationship status: Not on file  Other Topics Concern  . Not on file  Social History Narrative  . Not on file       Objective:   Physical Exam Vitals signs reviewed.  Constitutional:    General: She is not in acute distress.    Appearance: She is well-developed.  HENT:     Head: Normocephalic and atraumatic.     Right Ear: Tympanic membrane normal.     Left Ear: Tympanic membrane normal.  Eyes:     Pupils: Pupils are equal, round, and reactive to light.  Neck:     Musculoskeletal: Normal range of motion and neck supple.     Thyroid: No thyromegaly.  Cardiovascular:     Rate and Rhythm: Normal rate and regular rhythm.     Heart sounds: Normal heart sounds. No murmur.  Pulmonary:     Effort: Pulmonary effort is normal. No respiratory distress.     Breath sounds: Normal breath sounds. No wheezing.  Abdominal:     General: Bowel sounds are normal. There is no distension.     Palpations: Abdomen is soft.     Tenderness: There is no abdominal tenderness.  Musculoskeletal: Normal range of motion.        General: No tenderness.  Skin:    General: Skin is  warm and dry.  Neurological:     Mental Status: She is alert and oriented to person, place, and time.     Cranial Nerves: No cranial nerve deficit.     Deep Tendon Reflexes: Reflexes are normal and symmetric.  Psychiatric:        Behavior: Behavior normal.        Thought Content: Thought content normal.        Judgment: Judgment normal.       BP 121/81   Pulse 69   Temp (!) 97.4 F (36.3 C) (Oral)   Ht 5' 6.25" (1.683 m)   Wt 150 lb 3.2 oz (68.1 kg)   BMI 24.06 kg/m      Assessment & Plan:  Pamela LintSherri R Strausbaugh comes in today with chief complaint of form for work   Diagnosis and orders addressed:  1. Annual physical exam - TB Skin Test  2. Bee sting allergy - EPINEPHrine (EPIPEN) 0.3 mg/0.3 mL IJ SOAJ injection; Inject 0.3 mLs (0.3 mg total) into the muscle as needed.  Dispense: 2 Device; Refill: 0   Labs refused at this time Health Maintenance reviewed- Will get pap scanned into the chart Diet and exercise encouraged  Follow up plan: 1 year   Jannifer Rodneyhristy Leanny Moeckel, FNP

## 2018-07-12 NOTE — Patient Instructions (Signed)

## 2018-07-14 LAB — TB SKIN TEST
Induration: 0 mm
TB Skin Test: NEGATIVE

## 2023-09-18 ENCOUNTER — Encounter: Payer: Self-pay | Admitting: Advanced Practice Midwife

## 2023-11-11 ENCOUNTER — Telehealth: Payer: Self-pay | Admitting: Neurosurgery

## 2023-11-11 NOTE — Telephone Encounter (Signed)
 Patient is a previous patient of Dr Burton from [facility] and called and scheduled her follow up for 02/02/24 and stated she needs an MRI prior.

## 2023-11-12 ENCOUNTER — Other Ambulatory Visit: Payer: Self-pay | Admitting: Neurosurgery

## 2023-11-12 DIAGNOSIS — I671 Cerebral aneurysm, nonruptured: Secondary | ICD-10-CM

## 2023-12-05 ENCOUNTER — Ambulatory Visit
Admission: RE | Admit: 2023-12-05 | Discharge: 2023-12-05 | Disposition: A | Discharge status: Home / Self Care | Payer: Self-pay | Source: Ambulatory Visit | Attending: Neurosurgery | Admitting: Neurosurgery

## 2023-12-05 DIAGNOSIS — I671 Cerebral aneurysm, nonruptured: Secondary | ICD-10-CM

## 2024-01-07 NOTE — Progress Notes (Addendum)
 Answers submitted by the patient 20 Minute Same Day Visit on 01/07/2024  3:00 PM with Greig Fantasia, NP HPI - Cough/Respiratory (Submitted on 01/07/2024) Chief Complaint: RESPIRATORY COMPLAINT Duration of current symptoms: 6 Days other: Yes Treatments tried: rest, pain relievers, over-the-counter medication Special considerations: none apply Questionnaire about: RESPIRATORY COMPLAINT (Submitted on 01/07/2024) Chief Complaint: RESPIRATORY COMPLAINT   [facility] Visit Note    Pamela Harding is a 48 y.o. female who presents with  Chief Complaint  Patient presents with  . Respiratory   .   History obtained from patient.   Assessment & Plan:    Assessment & Plan Wheezing  Orders: .  albuterol (VENTOLIN HFA) 90 mcg/actuation inhaler; Inhale 2 puffs every 6 (six) hours as needed for wheezing for up to 30 days  Acute suppurative otitis media of left ear without spontaneous rupture of tympanic membrane, recurrence not specified  Orders: .  azithromycin (ZITHROMAX) 500 MG tablet; Take 1 tablet (500 mg total) by mouth daily for 3 days  Acute cough  Orders: .  benzonatate  (TESSALON ) 100 MG capsule; Swallow whole one (100mg ) capsule by mouth 3 times a day  as needed.Do not break, chew, dissolve, cut or crush.  History of asthma Stable on medication.      No follow-ups on file.  Subjective     Patient reports having a negative Flu and COVID test at home.  Respiratory Duration of current symptoms:  6 days Onset quality:  Slowly over time Cough Characteristics: productive of sputum   Productive Cough: purulent   Associated symptoms: cough, ear pain and other   Treatments tried:  Rest, pain relievers and over-the-counter medication Special considerations:  None apply    Allergies[1]  Review of Systems  Constitutional: Negative.   HENT:  Positive for ear pain. Negative for drooling, ear discharge, hearing loss, trouble swallowing and voice change.   Respiratory:  Positive  for cough.   Cardiovascular: Negative.   Neurological: Negative.   Psychiatric/Behavioral: Negative.       Objective     Vital Signs: BP 110/80 (Site: Right arm, Position: Sitting, Cuff Size: Large adult)   Pulse 90   Temp 98.6 F (37 C) (Oral)   Resp 18   Ht 5' 5 (1.651 m)   Wt 165 lb (74.8 kg)   SpO2 98%   BMI 27.46 kg/m    Physical Exam Constitutional:      General: She is awake. She is not in acute distress.    Appearance: She is not ill-appearing, toxic-appearing or diaphoretic.  HENT:     Head: Normocephalic.     Right Ear: Hearing, tympanic membrane, ear canal and external ear normal. Tympanic membrane is not erythematous.     Left Ear: Hearing, ear canal and external ear normal. Tympanic membrane is erythematous.     Nose: Nose normal.     Mouth/Throat:     Lips: Pink.     Mouth: Mucous membranes are moist.     Pharynx: Oropharynx is clear. Uvula midline.  Eyes:     Conjunctiva/sclera: Conjunctivae normal.  Cardiovascular:     Rate and Rhythm: Normal rate and regular rhythm.     Heart sounds: Normal heart sounds. No murmur heard. Pulmonary:     Effort: Pulmonary effort is normal. No respiratory distress.     Breath sounds: Normal breath sounds. No stridor. No wheezing, rhonchi or rales.     Comments: Breathing unlabored. Normal effort. Patient is able to inspire/expire without coughing, wheezing or respiratory  distress. Patient is able to speak in complete sentences. No cyanosis. No pursed lip breathing.   Chest:     Chest wall: No tenderness.  Neurological:     Mental Status: She is alert and oriented to person, place, and time.  Psychiatric:        Mood and Affect: Mood normal.        Behavior: Behavior normal. Behavior is cooperative.        Thought Content: Thought content normal.        Judgment: Judgment normal.          Clinical presentation significant for bacterial infection.  Antibiotics prescribed in accordance with [facility]  guidelines.                [1] Allergies Allergen Reactions  . Bee Pollen   . Doxycycline  GI Intolerance  . Penicillins Other (See Comments)    Unsure of RXN childhood allergy.   . Penicillins   . Penicillins Other (See Comments)    Child hood allergy.

## 2024-01-25 ENCOUNTER — Inpatient Hospital Stay
Admission: RE | Admit: 2024-01-25 | Discharge: 2024-01-25 | Disposition: A | Discharge status: Home / Self Care | Payer: Self-pay | Source: Ambulatory Visit | Attending: Neurosurgery | Admitting: Neurosurgery

## 2024-01-25 ENCOUNTER — Other Ambulatory Visit: Payer: Self-pay

## 2024-01-25 DIAGNOSIS — Z049 Encounter for examination and observation for unspecified reason: Secondary | ICD-10-CM

## 2024-02-02 ENCOUNTER — Ambulatory Visit: Payer: Self-pay | Admitting: Neurosurgery

## 2024-02-16 ENCOUNTER — Encounter: Payer: Self-pay | Admitting: Neurosurgery

## 2024-02-16 ENCOUNTER — Ambulatory Visit: Payer: Self-pay | Admitting: Neurosurgery

## 2024-02-16 VITALS — BP 122/88 | HR 66 | Temp 98.4°F | Ht 65.0 in | Wt 165.0 lb

## 2024-02-16 DIAGNOSIS — I720 Aneurysm of carotid artery: Secondary | ICD-10-CM

## 2024-02-16 NOTE — Progress Notes (Signed)
 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  She had an angiogram and demonstrated a very tiny aneurysm.  She had a follow-up MR angiogram.  This was read out to be 2.8 mm but when you put it side-by-side with the old MRA, there is no significant difference [the radiologist did not have access to the old MRA].  She is doing very well.  She noted to me at the end of the visit that her vision in her right eye had deteriorated.  She said that she saw an eye doctor but they told her that there has been no change.  I told her that I would like for her to confirm that she had visual field testing done and if she had not, to get this done.  After that I want her to send me the report and I will call her back.  She will get a 1 year follow-up MRA.
